# Patient Record
Sex: Female | Born: 1945 | Race: Black or African American | Hispanic: No | Marital: Single | Smoking: Never smoker
Health system: Southern US, Community
[De-identification: ages and names within clinical notes are randomized; demographics above are authoritative.]

## PROBLEM LIST (undated history)

## (undated) DIAGNOSIS — I10 Essential (primary) hypertension: Secondary | ICD-10-CM

---

## 2006-10-29 ENCOUNTER — Ambulatory Visit: Payer: Self-pay | Admitting: Internal Medicine

## 2006-10-29 ENCOUNTER — Encounter (INDEPENDENT_AMBULATORY_CARE_PROVIDER_SITE_OTHER): Payer: Self-pay | Admitting: Nurse Practitioner

## 2006-10-29 LAB — CONVERTED CEMR LAB
AST: 74 units/L — ABNORMAL HIGH (ref 0–37)
BUN: 14 mg/dL (ref 6–23)
Basophils Relative: 2 % — ABNORMAL HIGH (ref 0–1)
Eosinophils Absolute: 0.3 10*3/uL (ref 0.0–0.7)
Eosinophils Relative: 3 % (ref 0–5)
HCT: 16.1 % — ABNORMAL LOW (ref 36.0–46.0)
Helicobacter Pylori Antibody-IgG: 3.5 — ABNORMAL HIGH
Lymphocytes Relative: 40 % (ref 12–46)
Lymphs Abs: 3.9 10*3/uL — ABNORMAL HIGH (ref 0.7–3.3)
MCV: 100 fL (ref 78.0–100.0)
Neutrophils Relative %: 51 % (ref 43–77)
Platelets: 245 10*3/uL (ref 150–400)
RDW: 39 % — ABNORMAL HIGH (ref 11.5–14.0)
RPR Ser Ql: REACTIVE — AB
RPR Titer: 1:8 {titer}
Sodium: 138 meq/L (ref 135–145)
T Pallidum Abs: REACTIVE — AB
TSH: 3.017 microintl units/mL (ref 0.350–5.50)
Total Bilirubin: 1.7 mg/dL — ABNORMAL HIGH (ref 0.3–1.2)
Total Protein: 7.4 g/dL (ref 6.0–8.3)

## 2006-10-30 ENCOUNTER — Inpatient Hospital Stay (HOSPITAL_COMMUNITY): Admission: EM | Admit: 2006-10-30 | Discharge: 2006-11-04 | Payer: Self-pay | Admitting: *Deleted

## 2006-10-30 ENCOUNTER — Ambulatory Visit: Payer: Self-pay | Admitting: *Deleted

## 2006-11-01 ENCOUNTER — Ambulatory Visit: Payer: Self-pay | Admitting: Infectious Disease

## 2006-11-01 ENCOUNTER — Ambulatory Visit: Payer: Self-pay | Admitting: Oncology

## 2006-11-06 ENCOUNTER — Ambulatory Visit: Payer: Self-pay | Admitting: Gastroenterology

## 2006-11-08 ENCOUNTER — Ambulatory Visit: Payer: Self-pay | Admitting: Internal Medicine

## 2006-11-11 ENCOUNTER — Ambulatory Visit: Payer: Self-pay | Admitting: Internal Medicine

## 2006-11-15 ENCOUNTER — Ambulatory Visit: Payer: Self-pay | Admitting: Internal Medicine

## 2006-11-16 ENCOUNTER — Encounter (INDEPENDENT_AMBULATORY_CARE_PROVIDER_SITE_OTHER): Payer: Self-pay | Admitting: Nurse Practitioner

## 2006-11-16 LAB — CONVERTED CEMR LAB
Basophils Absolute: 0 10*3/uL (ref 0.0–0.1)
Lymphocytes Relative: 35 % (ref 12–46)
MCV: 97.3 fL (ref 78.0–100.0)
Monocytes Absolute: 0.8 10*3/uL — ABNORMAL HIGH (ref 0.2–0.7)
Monocytes Relative: 8 % (ref 3–11)
Neutro Abs: 5.2 10*3/uL (ref 1.7–7.7)
Platelets: 530 10*3/uL — ABNORMAL HIGH (ref 150–400)

## 2006-12-16 ENCOUNTER — Ambulatory Visit: Payer: Self-pay | Admitting: Internal Medicine

## 2007-01-24 ENCOUNTER — Encounter (INDEPENDENT_AMBULATORY_CARE_PROVIDER_SITE_OTHER): Payer: Self-pay | Admitting: Nurse Practitioner

## 2007-01-24 ENCOUNTER — Ambulatory Visit: Payer: Self-pay | Admitting: Internal Medicine

## 2007-01-24 LAB — CONVERTED CEMR LAB
Basophils Absolute: 0 10*3/uL (ref 0.0–0.1)
Eosinophils Relative: 6 % — ABNORMAL HIGH (ref 0–5)
HCT: 41.9 % (ref 36.0–46.0)
Hemoglobin: 12.9 g/dL (ref 12.0–15.0)
Lymphocytes Relative: 42 % (ref 12–46)
MCHC: 30.8 g/dL (ref 30.0–36.0)
MCV: 84.3 fL (ref 78.0–100.0)
Monocytes Relative: 6 % (ref 3–11)
RBC: 4.97 M/uL (ref 3.87–5.11)
Vitamin B-12: >2000 pg/mL — ABNORMAL HIGH (ref 211–911)

## 2007-03-14 ENCOUNTER — Encounter (INDEPENDENT_AMBULATORY_CARE_PROVIDER_SITE_OTHER): Payer: Self-pay | Admitting: Nurse Practitioner

## 2007-03-14 ENCOUNTER — Ambulatory Visit: Payer: Self-pay | Admitting: Internal Medicine

## 2007-03-14 LAB — CONVERTED CEMR LAB
Basophils Absolute: 0 10*3/uL (ref 0.0–0.1)
Basophils Relative: 0 % (ref 0–1)
Eosinophils Absolute: 0.4 10*3/uL (ref 0.2–0.7)
Eosinophils Relative: 5 % (ref 0–5)
MCV: 81.6 fL (ref 78.0–100.0)
Monocytes Absolute: 0.6 10*3/uL (ref 0.1–1.0)
Monocytes Relative: 7 % (ref 3–12)
RBC: 4.84 M/uL (ref 3.87–5.11)

## 2008-06-25 ENCOUNTER — Ambulatory Visit: Payer: Self-pay | Admitting: Family Medicine

## 2008-06-25 ENCOUNTER — Encounter (INDEPENDENT_AMBULATORY_CARE_PROVIDER_SITE_OTHER): Payer: Self-pay | Admitting: Internal Medicine

## 2008-06-25 LAB — CONVERTED CEMR LAB
ALT: 24 units/L (ref 0–35)
Albumin: 4.5 g/dL (ref 3.5–5.2)
Alkaline Phosphatase: 68 units/L (ref 39–117)
BUN: 11 mg/dL (ref 6–23)
Basophils Relative: 0 % (ref 0–1)
CO2: 22 meq/L (ref 19–32)
Calcium: 9.3 mg/dL (ref 8.4–10.5)
Chloride: 104 meq/L (ref 96–112)
Eosinophils Absolute: 0.3 10*3/uL (ref 0.0–0.7)
Hemoglobin: 11.2 g/dL — ABNORMAL LOW (ref 12.0–15.0)
Lymphocytes Relative: 31 % (ref 12–46)
Lymphs Abs: 3.3 10*3/uL (ref 0.7–4.0)
MCHC: 29.2 g/dL — ABNORMAL LOW (ref 30.0–36.0)
Neutro Abs: 6.3 10*3/uL (ref 1.7–7.7)
Platelets: 402 10*3/uL — ABNORMAL HIGH (ref 150–400)
RDW: 16.3 % — ABNORMAL HIGH (ref 11.5–15.5)
TSH: 3.202 microintl units/mL (ref 0.350–4.500)
Total Bilirubin: 0.2 mg/dL — ABNORMAL LOW (ref 0.3–1.2)
Total Protein: 7.9 g/dL (ref 6.0–8.3)
Vitamin B-12: 494 pg/mL (ref 211–911)
WBC: 10.7 10*3/uL — ABNORMAL HIGH (ref 4.0–10.5)

## 2008-06-26 ENCOUNTER — Encounter (INDEPENDENT_AMBULATORY_CARE_PROVIDER_SITE_OTHER): Payer: Self-pay | Admitting: Internal Medicine

## 2008-06-28 ENCOUNTER — Encounter (INDEPENDENT_AMBULATORY_CARE_PROVIDER_SITE_OTHER): Payer: Self-pay | Admitting: Internal Medicine

## 2008-06-28 ENCOUNTER — Ambulatory Visit: Payer: Self-pay | Admitting: Internal Medicine

## 2008-06-28 LAB — CONVERTED CEMR LAB
Saturation Ratios: 16 % — ABNORMAL LOW (ref 20–55)
UIBC: 257 ug/dL

## 2008-06-29 ENCOUNTER — Encounter (INDEPENDENT_AMBULATORY_CARE_PROVIDER_SITE_OTHER): Payer: Self-pay | Admitting: Internal Medicine

## 2008-07-14 ENCOUNTER — Encounter (INDEPENDENT_AMBULATORY_CARE_PROVIDER_SITE_OTHER): Payer: Self-pay | Admitting: Internal Medicine

## 2008-07-14 ENCOUNTER — Ambulatory Visit: Payer: Self-pay | Admitting: Internal Medicine

## 2008-07-14 LAB — CONVERTED CEMR LAB: RPR Ser Ql: REACTIVE — AB

## 2008-07-16 ENCOUNTER — Ambulatory Visit: Payer: Self-pay | Admitting: Internal Medicine

## 2008-07-20 ENCOUNTER — Ambulatory Visit: Payer: Self-pay | Admitting: Internal Medicine

## 2008-07-20 ENCOUNTER — Encounter (INDEPENDENT_AMBULATORY_CARE_PROVIDER_SITE_OTHER): Payer: Self-pay | Admitting: Internal Medicine

## 2008-07-20 LAB — CONVERTED CEMR LAB: Varicella IgG: 4.47 — ABNORMAL HIGH

## 2008-07-22 ENCOUNTER — Encounter: Admission: RE | Admit: 2008-07-22 | Discharge: 2008-07-22 | Payer: Self-pay | Admitting: Pulmonary Disease

## 2008-08-16 ENCOUNTER — Ambulatory Visit: Payer: Self-pay | Admitting: Family Medicine

## 2008-08-23 ENCOUNTER — Ambulatory Visit: Payer: Self-pay | Admitting: Internal Medicine

## 2008-08-30 ENCOUNTER — Ambulatory Visit: Payer: Self-pay | Admitting: Internal Medicine

## 2009-01-04 IMAGING — US US ABDOMEN COMPLETE
1 series · 3 of 3 positions shown · non-contrast
Comparison: none

CLINICAL DATA: Elevated enzymes.
ABDOMINAL ULTRASOUND:
TECHNIQUE: Complete abdominal ultrasound examination was performed including evaluation of the liver, gallbladder, bile ducts, pancreas, kidneys, spleen, IVC, and abdominal aorta.

[Series 1: unknown · 0.25mm/px · 3 of 3 slices shown]
[im 1/3]
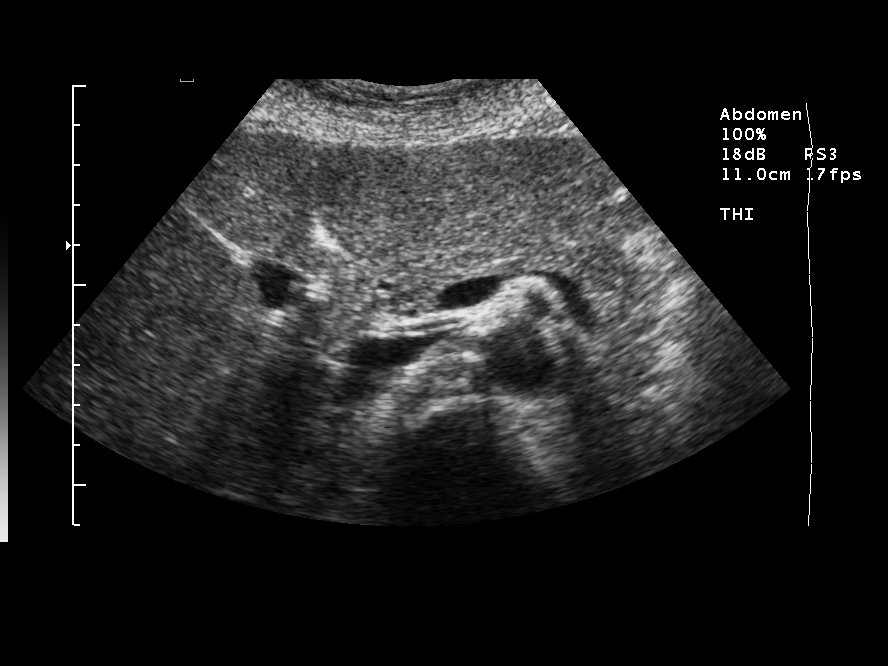
[im 2/3]
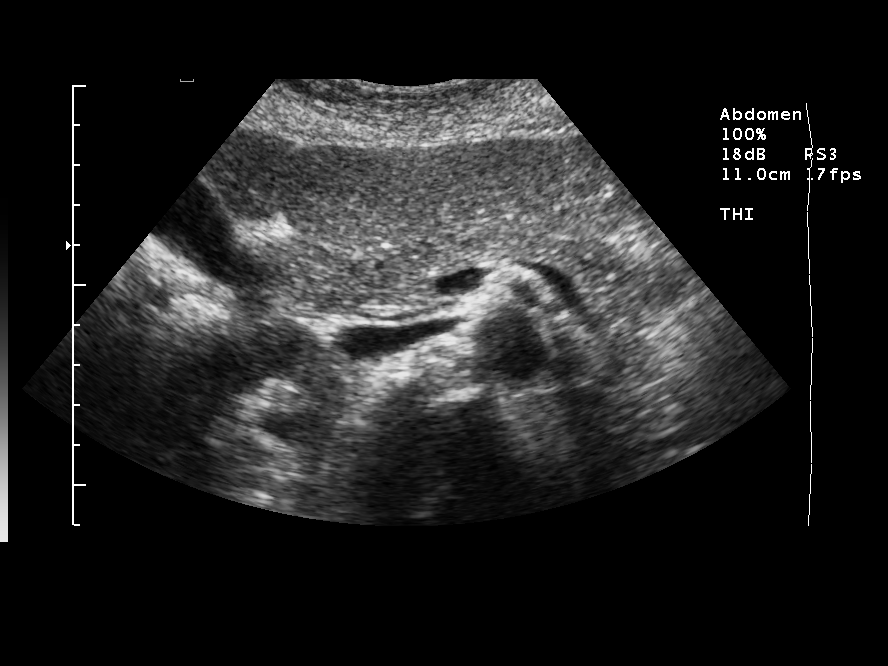
[im 3/3]
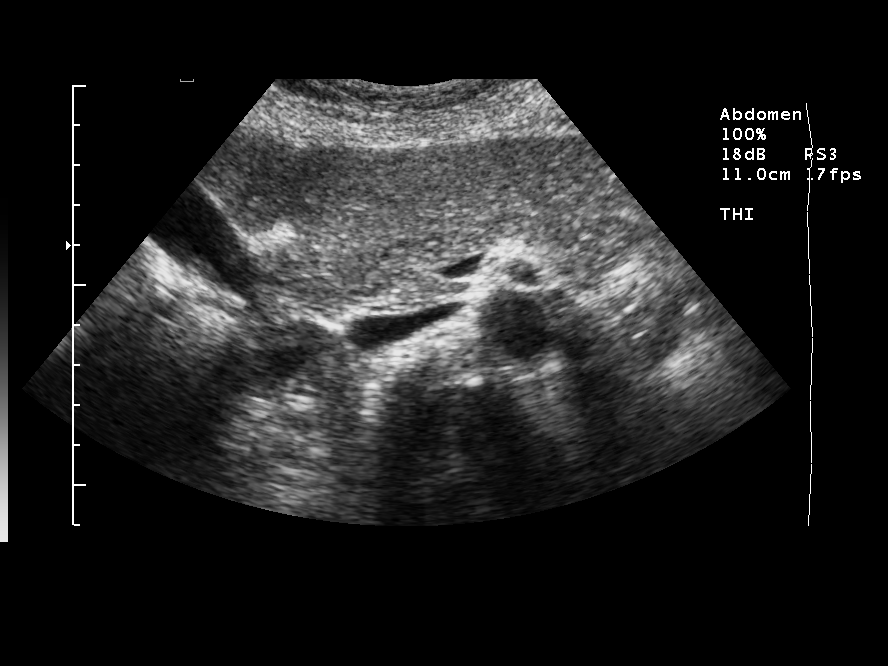

[3 of 3 positions shown; findings below may reference images not displayed]

FINDINGS: Slightly heterogeneous appearance of the liver without focal abnormality.  There is also slightly increased echogenicity of the liver.  No evidence for gallstones or sludge.  Common bile duct measures 4mm.  Right kidney has normal morphology measuring 9.7cm without hydronephrosis.  Left kidney measures 10cm in length without hydronephrosis.  No evidence for ascites.
IMPRESSION: 1. Heterogeneous and slightly increased echogenicity of the liver may represent a fatty liver.
2. No evidence for gallstones or biliary dilatation.

## 2009-01-17 ENCOUNTER — Ambulatory Visit: Payer: Self-pay | Admitting: Family Medicine

## 2009-01-20 ENCOUNTER — Encounter: Admission: RE | Admit: 2009-01-20 | Discharge: 2009-01-20 | Payer: Self-pay | Admitting: Internal Medicine

## 2010-01-27 ENCOUNTER — Encounter (INDEPENDENT_AMBULATORY_CARE_PROVIDER_SITE_OTHER): Payer: Self-pay | Admitting: *Deleted

## 2010-01-27 LAB — CONVERTED CEMR LAB
Basophils Absolute: 0 10*3/uL (ref 0.0–0.1)
Basophils Relative: 0 % (ref 0–1)
CO2: 24 meq/L (ref 19–32)
Calcium: 9.5 mg/dL (ref 8.4–10.5)
Chloride: 103 meq/L (ref 96–112)
Eosinophils Absolute: 0.5 10*3/uL (ref 0.0–0.7)
Hemoglobin: 11.8 g/dL — ABNORMAL LOW (ref 12.0–15.0)
Lymphocytes Relative: 37 % (ref 12–46)
MCV: 77.4 fL — ABNORMAL LOW (ref 78.0–100.0)
Monocytes Absolute: 0.5 10*3/uL (ref 0.1–1.0)
Monocytes Relative: 6 % (ref 3–12)
Neutrophils Relative %: 51 % (ref 43–77)
Platelets: 357 10*3/uL (ref 150–400)
Potassium: 4.2 meq/L (ref 3.5–5.3)
Sodium: 142 meq/L (ref 135–145)
Total Protein: 7.7 g/dL (ref 6.0–8.3)

## 2010-08-22 NOTE — H&P (Signed)
NAMEGRACEE, RATTERREE          ACCOUNT NO.:  [id number]   MEDICAL RECORD NO.:  [id number]          PATIENT TYPE:  EMS   LOCATION:  MAJO                         FACILITY:  [facility]   PHYSICIAN:  Michaelyn Barter, M.D. DATE OF BIRTH:  1946-02-20   DATE OF ADMISSION:  10/30/2006  DATE OF DISCHARGE:                              HISTORY & PHYSICAL   PRIMARY CARE PHYSICIAN:  Unassigned.   CHIEF COMPLAINT:  Abnormal hemoglobin level.   HISTORY OF PRESENT ILLNESS:  The patient is a 65 year old [place] female  who speaks no [place].  Her son, Greggory Stallion, is at the bedside and he gives  the history.  He states that he took his mother, who is the patient, to  [facility] yesterday for routine checkup.  Routine labs were drawn.  This morning the patient's son was called and told that the patient's  hemoglobin was very low and that he should bring her to the hospital for  further evaluation.  According to the patient's son, the patient has not  had any recent complaints.  She does occasionally complain of dizziness,  but not over the past day or two.  The patient denies having any belly  pain, there has been no bright red blood in her stools, no black stools,  no vomiting of blood, no shortness of breath, and no chest pain.   PAST MEDICAL HISTORY:  No illnesses.   PAST SURGICAL HISTORY:  None.   ALLERGIES:  No known drug allergies.   HOME MEDICATIONS:  None.   FAMILY HISTORY:  Mother's medical history is unknown, father's medical  history is unknown.   SOCIAL HISTORY:  Cigarettes - denies.  Alcohol - denies.   REVIEW OF SYSTEMS:  As per HPI.   PHYSICAL EXAMINATION:  GENERAL:  The patient is awake.  She is in no  obvious respiratory distress.  She looks comfortable.  VITAL SIGNS:  Temperature 97.1, blood pressure 98/61, heart rate 95,  respirations 18, O2 saturation 100% on room air.  HEENT:  Normocephalic and atraumatic.  Pupils equal, round, and reactive  to light.  Oral mucosa is  pink.  No thrush and no exudates.  NECK:  Supple.  Neck veins are distended.  No lymphadenopathy and no  thyromegaly.  HEART:  S1 and S2 present, regular rate and rhythm.  No murmurs, rubs,  or gallops.  LUNGS:  No crackles or wheezes.  ABDOMEN:  Flat, soft, nontender, and nondistended.  No palpable  hepatosplenomegaly.  No masses.  Bowel sounds are present x4 quadrants.  EXTREMITIES:  No leg edema.  NEUROLOGY:  The patient is alert and oriented to person and date.  With  regards to place, the patient's son indicated that she is not sure where  she is at.  MUSCULOSKELETAL:  It is difficult to get the patient to follow commands  secondary to the language barrier, therefore, I could not assess  musculoskeletal.  RECTAL:  This was done by the emergency room physician's assistant,  Charlestine Night.  According to Mr. Lawyer there was very scant amount  of blood in the rectal vault.  There was no obvious bleeding, no  bright  red blood.  The patient was heme negative.  No masses were palpated.   LABORATORY DATA:  These labs were drawn at [facility].  The patient's  white blood cell count 9.7, hemoglobin 4.9, hematocrit 16.1, platelets  39.0.  Target cells were noted, schistocytes were present. Sodium 138,  potassium 4.5, chloride 103, CO2 28, glucose 91, BUN 14, creatinine  0.75.  Bilirubin total 1.7, alkaline phosphatase 58, AST 74, ALT 17,  total protein 7.4, albumin 4.4, calcium 9.3.  TSH 3.017.  HIV  nonreactive.  RPR reactive.  RPR titer 1:8.   ASSESSMENT:  1. Severe anemia.  The acuity of this is questionable.  The source is      also questionable.  This is possibly GI versus other sources      related.  It appears that the patient has been tolerating the low      anemia for quite some time although she has had some dizziness      which may have been related to the severe anemia.  The severe      anemia is possibly secondary to GI versus other source.  The      presence of  schistocytes makes one concerned about the possibility      of intravascular hemolysis.  We will go ahead and transfuse the      patient several units of packed red blood cells for now.  We will      check a DIC profile as well as a Comb's test.  We will request GI      consultation in the morning.  The patient may need an EGD versus      colonoscopy.  We will start the patient on Protonix and we will      order frequent H&H's for now.  2. Positive RPR.  We will repeat this for now.  3. Elevated SGOT and bilirubin on labs from [facility].  This      may again be related to the patient's current anemia and/or the      process that triggered it.  The patient currently denies having any      abdominal pain.  However, we will go ahead and check an ultrasound      of the patient's liver.  4. Mild pyuria.  The patient is otherwise asymptomatic secondary to      this.  We will consider starting an empiric antibiotic.      Michaelyn Barter, M.D.  Electronically Signed     OR/MEDQ  D:  10/30/2006  T:  10/31/2006  Job:  [id number]

## 2010-08-22 NOTE — Discharge Summary (Signed)
[name], [name]          ACCOUNT NO.:  [id number]   MEDICAL RECORD NO.:  [id number]          PATIENT TYPE:  INP   LOCATION:  6729                         FACILITY:  [facility]   PHYSICIAN:  Isidor Holts, M.D.  DATE OF BIRTH:  May 09, 1945   DATE OF ADMISSION:  10/30/2006  DATE OF DISCHARGE:  11/04/2006                               DISCHARGE SUMMARY   PMD:  [facility].  Patient is unassigned to [place].   DISCHARGE DIAGNOSES:  1. Vitamin B12 deficiency.  2. Profound anemia and low grade hemolysis, secondary to # 1 above.  3. Late latent syphilis.  4. Fatty liver.   DISCHARGE MEDICATIONS:  1. Folic acid 1 mg p.o. daily.  2. Vitamin B12 injection 1 mg IM q.monthly.  3. Penicillin G benzathine 2.4 million units intramuscularly on November 09, 2006, and again on November 16, 2006, then stop.   PROCEDURES:  Abdominal ultrasound scan dated October 31, 2006.  This showed  heterogenous and slightly increased echogenicity of the liver, which may  represent a fatty liver.  There was no evidence of gallstones or biliary  dilatation.   CONSULTATIONS:  1. Dr. Melvia Heaps, Gastroenterology.  2. Dr. Russella Dar, Gastroenterology.  3. Dr. Paulette Blanch Dam, Infectious Diseases.  4. Dr. Orvan Falconer, Infectious Diseases.  5. Dr. Darrold Span, hematologist.   ADMISSION HISTORY:  As in H&P notes of October 30, 2006, dictated by Dr.  Michaelyn Barter.  However, in brief, this is a 65 year old [place]  female, with no significant past medical or surgical history who was  referred from [facility] following incidental discovery of a  very low hemoglobin level on routine laboratory tests, during her office  visit.  On detailed questioning, she admitted to occasional episodes of  dizziness but denied shortness of breath or chest pain, denied  hematochezia or melena.  She was admitted for further evaluation,  investigation and management.   CLINICAL COURSE:  1. Profound anemia.  For details of  presentation, refer to Admission      History above.  The patient was found on laboratory testing to have      a hemoglobin of 4.9, hematocrit 16.1, platelets 39,000, WBC 9.7.      Blood smear showed target cells, schistocytes, left shift with      elevated D-dimer and normal fibrinogen levels.  Iron studies      demonstrated iron of 115, TIBC 227, % saturation 42, ferritin 174.      B12 was markedly diminished at 92, folate was 12.5, haptoglobin was      less than 6.  Clearly, these findings were consistent with profound      Vitamin B12 deficiency and low grade hemolysis.  Patient was      transfused with a total of 4 units PRBC, with satisfactory bump in      hemoglobin levels to 11.9 on November 01, 2006.  Hematology      consultation was called, which was kindly provided by Dr. Darrold Span      who has opined that patient's hematologic findings were entirely      consistent with Vitamin B12  deficiency and recommended Vitamin B12      replacement which has been instituted accordingly, with monthly      Vitamin B12 injections.  In addition, patient has been placed on      folate supplementation.   1. Fatty liver.  Patient at the time of presentation was found to have      mildly abnormal LFTs with total bilirubin of 1.7, alkaline      phosphatase 58, AST 74, ALT 17.  She underwent a liver ultrasound      scan.  For details of findings, refer to Procedure List above.      This demonstrated possible fatty liver.  A Hepatitis profile was      done which showed positive Hepatitis B Core Antibody, positive      Hepatitis B Surface Antibody, i.e. consistent with immunity,      Hepatitis C Antibody was negative.  An RPR was done, and came back      positive at a titer of 1:8.  This was followed by TPPA, which      returned positive, necessitating Infectious Diseases consultation,      which was kindly provided by Drs. Cornelius Kingsland and Cliffton Asters.  Patient has been diagnosed with late  latent syphilis and      recommended a 3 week course of Penicillin G benzathine on a weekly      basis.  The first dose was administered on November 02, 2006, and it is      anticipated that she will complete this course of treatment on      November 16, 2006, under the auspices of [facility] on an outpatient      basis.   1. Late latent syphilis.  For details, refer to #2 above.  This      diagnosis was based on a finding of a positive RPR in a titer of      1:8 as well as a positive TPPA and patient is currently on      treatment with intramuscular injections of Penicillin G benzathine.   DISPOSITION:  Gastroenterology consultation was called to evaluate the  possibility of GI workup for patient's profound anemia.  Patient was  therefore seen by Drs. Melvia Heaps and Claudette Head.  Fecal occult  blood testing was negative.  In view of patient's Vitamin B12  deficiency, she has been recommended anti-intrinsic factor and  antigastric parietal cell antibodies.  These studies have been ordered,  but at the time of this dictation were still pending.  Per Dr. Claudette Head, should these prove positive, patient may be eligible for upper GI  endoscopy; presumably, this may be arranged on an outpatient basis.  It  is, however, opined that colonoscopy at this time is not warranted for  patient's anemia; although, on the basis of routine screening purposes  given patient's age, this might be a consideration in the future.  We  have deferred decision to the gastroenterologist.  Patient had thick and  thin smear sent for malaria studies; however, this was negative.   Provided no acute problems arise in the interim, it is anticipated that  patient will be discharged on or about November 04, 2006.   DIET:  No restrictions.   ACTIVITY:  As tolerated.   FOLLOWUP INSTRUCTIONS:  Patient is to follow up with her primary M.D. at  [facility] with in 1 week of discharge.  She is also to follow up  with   gastroenterologist, Dr. Claudette Head or Dr. Arlyce Dice at a date to be  determined.   SPECIAL INSTRUCTIONS:  We anticipate that patient will continue to  receive her Vitamin B12 injections at the [facility].  However,  her Penicillin G benzathine injections may be more problematic.  This  may, of course, be completed at the [facility] clinic.  Alternatively,  she may have this done at the [facility] at [facility].      Isidor Holts, M.D.  Electronically Signed     CO/MEDQ  D:  11/03/2006  T:  11/03/2006  Job:  [id number]   cc:   Kelly Splinter. Russella Dar, MD, Joella Prince D. Arlyce Dice, MD,FACG  Cliffton Asters, M.D.

## 2010-08-22 NOTE — Consult Note (Signed)
[name], [name]          ACCOUNT NO.:  [id number]   MEDICAL RECORD NO.:  [id number]          PATIENT TYPE:  INP   LOCATION:  3733                         FACILITY:  [facility]   PHYSICIAN:  Lennis P. Livesay, M.D.DATE OF BIRTH:  [birth year]   DATE OF CONSULTATION:  11/01/2006  DATE OF DISCHARGE:                                 CONSULTATION   HEMATOLOGY CONSULT NOTE:   HISTORY OF PRESENT ILLNESS:  The patient is a 65 year old lady from  [place], seen in consultation at the request of the hospitalist service,  for severe anemia.  History is from the son as interpreter and the  hospital records.  We have very little information about her past  medical history.  Patient had presented to [facility] the day prior to  admission with labs there showing a hemoglobin of 4.9.  She was admitted  through the emergency department to the hospitalist service at the  request of [facility].  Workup is continuing, with some significant  findings already to this point.  The patient had not been aware of any  bleeding prior to admission.  She denies shortness of breath or pain  other than right upper quadrant pain, which has been intermittent today.  She has not eaten well shortly prior to admission, had not vomiting and  I cannot tell if she had lost taste with the food.  At any rate, her son  tells me that she has lost some amount of weight, but is unclear about  this.  She has felt much stronger since packed red cell transfusions,  walking in the room today and has been able to eat better.   EVALUATION IN HOSPITAL:  Include labs on October 30, 2006, hemoglobin 5.4,  white count 7.8, platelets 326, MCV 97, RDW 42, segs 70, lymphs 25,  monos 3, EOs 2, blast 0, shift-to-sites, teardrops and polychromasia on  the smear.  Abdominal ultrasound negative for gallstones, no mention of  any spleen abnormalities and liver with fatty replacement.  She has had  4 units of packed red cells, with repeat CBC July  25, after the  transfusions, hemoglobin up to 11.9, white count 7.3, platelets 235, BUN  is 11, creatinine 0.7.  Iron stores good with serum iron 113, 42%  saturated, ferritin 174.  DAT negative.  Total bilirubin 1.6.  Folate  normal at 174.  B12 low at 92.  Heptoglobin less than 6.  Stools  negative per GI.  Urinalysis 3-5 red cells.  HIV negative.  RPR is low  positive titer.  Hepatitis B pending.  Malaria smear is pending and she  has been seen in consultation by GI and infectious disease.   In addition to 4 units of packed red cell transfusions, patient has  begun folic acid at 1 mg daily and B12 1000 mg IM daily x5.  Antibodies  to intrinsic factor and parietal cells are pending.   PHYSICAL EXAMINATION:  GENERAL:  She is a pleasant lady, looks comfort  supine in bed on room air.  She does not speak [place], but she seems to  understand a little and son is interpreter.  VITAL SIGNS:  Temperature 97.1.  Heart rate 71 and regular.  Respirations 20.  Blood pressure 97/61, 99% saturated room air.  NECK:  No cervical or lymph nodes.  LUNGS:  Clear.  HEENT:  Mucous membranes are pink and moist and her tongue does not  appear extremely smooth.  HEART:  Has a regular rate and rhythm.  She is not tender in the  epigastrium or the right upper quadrant.  I cannot get her undressed to  do a breast exam.  ABDOMEN:  Soft, quiet, no hepatosplenomegaly.  LOWER EXTREMITIES:  No edema, cords or tenderness.   Peripheral blood smear requested and I will review.   IMPRESSION/RECOMMENDATION:  1. Severe anemia with some schistocytes, left shift, low heptoglobin,      etc. Could all be secondary to B12 deficiency.  I agree with the      B12 as planned and a supplemental folate.  We will await malaria      investigation and rest of studies by infectious disease and      gastroenterology.  2. We will followup next week.  Please call over the weekend if my      partners can assist.  Thank you for the  consultation.      Lennis P. Darrold Span, M.D.  Electronically Signed     LPL/MEDQ  D:  11/01/2006  T:  11/02/2006  Job:  [id number]

## 2011-01-22 LAB — CROSSMATCH
ABO/RH(D): A POS
Antibody Screen: NEGATIVE

## 2011-01-22 LAB — DIFFERENTIAL
Band Neutrophils: 0
Basophils Absolute: 0
Basophils Relative: 0
Blasts: 0
Eosinophils Absolute: 0.2
Eosinophils Relative: 2
Lymphocytes Relative: 25
Lymphs Abs: 2
Metamyelocytes Relative: 0
Monocytes Absolute: 0.2
Monocytes Relative: 3
Myelocytes: 0
Neutro Abs: 5.4
Neutrophils Relative %: 70
Promyelocytes Absolute: 0
nRBC: 0

## 2011-01-22 LAB — URINALYSIS, ROUTINE W REFLEX MICROSCOPIC
Bilirubin Urine: NEGATIVE
Glucose, UA: NEGATIVE
Ketones, ur: NEGATIVE
Nitrite: NEGATIVE
Protein, ur: 30 — AB
Specific Gravity, Urine: 1.012
Urobilinogen, UA: 2 — ABNORMAL HIGH
pH: 6.5

## 2011-01-22 LAB — I-STAT 8, (EC8 V) (CONVERTED LAB)
Acid-Base Excess: 1
BUN: 14
Bicarbonate: 25.5 — ABNORMAL HIGH
Chloride: 105
Glucose, Bld: 117 — ABNORMAL HIGH
HCT: 19 — ABNORMAL LOW
Hemoglobin: 6.5 — CL
Operator id: 272551
Potassium: 3.9
Sodium: 139
TCO2: 27
pCO2, Ven: 41.4 — ABNORMAL LOW
pH, Ven: 7.397 — ABNORMAL HIGH

## 2011-01-22 LAB — DIC (DISSEMINATED INTRAVASCULAR COAGULATION)PANEL
INR: 1
Platelets: 271
aPTT: 26

## 2011-01-22 LAB — CBC
HCT: 15.9 — ABNORMAL LOW
HCT: 29.7 — ABNORMAL LOW
HCT: 31.3 — ABNORMAL LOW
HCT: 35.1 — ABNORMAL LOW
HCT: 36.3
Hemoglobin: 10 — ABNORMAL LOW
Hemoglobin: 11.9 — ABNORMAL LOW
Hemoglobin: 12.3
Hemoglobin: 5.4 — CL
MCHC: 33.9
MCHC: 34
MCV: 95.8
MCV: 97.8
Platelets: 169
Platelets: 219
Platelets: 271
Platelets: 326
RBC: 1.63 — ABNORMAL LOW
RBC: 3.09 — ABNORMAL LOW
RDW: 17.1 — ABNORMAL HIGH
RDW: 42.3 — ABNORMAL HIGH
WBC: 5.9
WBC: 6.8
WBC: 7.7
WBC: 7.8

## 2011-01-22 LAB — MALARIA SMEAR

## 2011-01-22 LAB — COMPREHENSIVE METABOLIC PANEL
Albumin: 4.2
Alkaline Phosphatase: 62
BUN: 7
CO2: 26
Chloride: 102
GFR calc non Af Amer: >60
Potassium: 3.6
Total Bilirubin: 1.6 — ABNORMAL HIGH

## 2011-01-22 LAB — FERRITIN: Ferritin: 174 (ref 10–291)

## 2011-01-22 LAB — URINE MICROSCOPIC-ADD ON

## 2011-01-22 LAB — HEPATIC FUNCTION PANEL
ALT: 18
AST: 71 — ABNORMAL HIGH
Albumin: 3.9
Alkaline Phosphatase: 52
Bilirubin, Direct: 0.4 — ABNORMAL HIGH
Indirect Bilirubin: 0.8
Total Bilirubin: 1.2
Total Protein: 6.7

## 2011-01-22 LAB — BASIC METABOLIC PANEL
BUN: 18
CO2: 29
Chloride: 109
GFR calc Af Amer: >60
GFR calc non Af Amer: >60
Glucose, Bld: 92
Potassium: 3.4 — ABNORMAL LOW
Potassium: 3.9
Potassium: 4.4
Sodium: 137
Sodium: 139

## 2011-01-22 LAB — IRON AND TIBC
Iron: 113
Saturation Ratios: 42
TIBC: 271
UIBC: 158

## 2011-01-22 LAB — RPR: RPR Ser Ql: REACTIVE — AB

## 2011-01-22 LAB — FOLATE: Folate: 12.5

## 2011-01-22 LAB — VITAMIN B12: Vitamin B-12: 92 — ABNORMAL LOW (ref 211–911)

## 2011-01-22 LAB — HEPATITIS C ANTIBODY: HCV Ab: NEGATIVE

## 2011-01-22 LAB — POCT I-STAT CREATININE
Creatinine, Ser: 0.7
Operator id: 272551

## 2011-01-22 LAB — PATHOLOGIST SMEAR REVIEW

## 2011-01-22 LAB — HAPTOGLOBIN: Haptoglobin: <6 — ABNORMAL LOW

## 2011-01-22 LAB — D-DIMER, QUANTITATIVE: D-Dimer, Quant: 17.02 — ABNORMAL HIGH

## 2018-01-30 ENCOUNTER — Emergency Department (HOSPITAL_COMMUNITY)
Admission: EM | Admit: 2018-01-30 | Discharge: 2018-01-31 | Disposition: A | Discharge status: Home / Self Care | Payer: Medicaid Other | Attending: Emergency Medicine | Admitting: Emergency Medicine

## 2018-01-30 ENCOUNTER — Encounter (HOSPITAL_COMMUNITY): Payer: Self-pay | Admitting: Emergency Medicine

## 2018-01-30 ENCOUNTER — Emergency Department (HOSPITAL_COMMUNITY): Payer: Medicaid Other

## 2018-01-30 ENCOUNTER — Other Ambulatory Visit: Payer: Self-pay

## 2018-01-30 DIAGNOSIS — I951 Orthostatic hypotension: Secondary | ICD-10-CM

## 2018-01-30 DIAGNOSIS — N390 Urinary tract infection, site not specified: Secondary | ICD-10-CM | POA: Diagnosis not present

## 2018-01-30 DIAGNOSIS — R42 Dizziness and giddiness: Secondary | ICD-10-CM | POA: Diagnosis present

## 2018-01-30 HISTORY — DX: Essential (primary) hypertension: I10

## 2018-01-30 LAB — BASIC METABOLIC PANEL
ANION GAP: 9 (ref 5–15)
BUN: 13 mg/dL (ref 8–23)
CO2: 26 mmol/L (ref 22–32)
Calcium: 9.5 mg/dL (ref 8.9–10.3)
Chloride: 104 mmol/L (ref 98–111)
Creatinine, Ser: 0.8 mg/dL (ref 0.44–1.00)
GFR calc Af Amer: >60 mL/min (ref >60)
GFR calc non Af Amer: >60 mL/min (ref >60)
GLUCOSE: 100 mg/dL — AB (ref 70–99)
POTASSIUM: 3.8 mmol/L (ref 3.5–5.1)
Sodium: 139 mmol/L (ref 135–145)

## 2018-01-30 LAB — CBC
HCT: 37.4 % (ref 36.0–46.0)
HEMOGLOBIN: 11 g/dL — AB (ref 12.0–15.0)
MCH: 22.6 pg — ABNORMAL LOW (ref 26.0–34.0)
MCHC: 29.4 g/dL — ABNORMAL LOW (ref 30.0–36.0)
MCV: 76.8 fL — ABNORMAL LOW (ref 80.0–100.0)
Platelets: 382 10*3/uL (ref 150–400)
RBC: 4.87 MIL/uL (ref 3.87–5.11)
RDW: 16 % — ABNORMAL HIGH (ref 11.5–15.5)
WBC: 9.6 10*3/uL (ref 4.0–10.5)
nRBC: 0 % (ref 0.0–0.2)

## 2018-01-30 NOTE — ED Provider Notes (Signed)
[facility] EMERGENCY DEPARTMENT Provider Note   CSN: [id number] Arrival date & time: 01/30/18  1602     History   Chief Complaint Chief Complaint  Patient presents with  . Dizziness    HPI [name] is a 73 y.o. female.  Level 5 caveat for language barrier.  Son used as Nurse, learning disability.  Patient speaks [place].  Son reports patient had sensation of "heart beating fast" throughout last night and all day today.  This is associated with palpitations.  No chest pain or shortness of breath.  Son reports patient has had this for several months in the past but never been evaluated by Dr. before.  She has a history of hypertension but has not had medications since June.  She last left the country in June.  She feels back to baseline now.  She denies headache, visual changes, chest pain, shortness of breath, abdominal pain, nausea, vomiting or diarrhea.  No focal neurological deficits.  No difficulty speaking or difficulty swallowing.  No visual changes.  She said chills and hot flashes at home but no documented fever. She denies any vertigo or spinning sensation.  She describes more lightheadedness and hot flashes or palpitations but the symptoms have since resolved.  The history is provided by the patient and a relative. The history is limited by a language barrier. A language interpreter was used.  Dizziness  Associated symptoms: palpitations   Associated symptoms: no chest pain, no nausea, no shortness of breath, no vomiting and no weakness     Past Medical History:  Diagnosis Date  . Hypertension     There are no active problems to display for this patient.   History reviewed. No pertinent surgical history.   OB History   None      Home Medications    Prior to Admission medications   Not on File    Family History No family history on file.  Social History Social History   Tobacco Use  . Smoking status: Never Smoker  . Smokeless tobacco:  Never Used  Substance Use Topics  . Alcohol use: Never    Frequency: Never  . Drug use: Never     Allergies   Patient has no known allergies.   Review of Systems Review of Systems  Constitutional: Positive for chills and fatigue. Negative for activity change, appetite change and fever.  HENT: Negative for congestion.   Eyes: Negative for photophobia and visual disturbance.  Respiratory: Negative for cough, chest tightness and shortness of breath.   Cardiovascular: Positive for palpitations. Negative for chest pain.  Gastrointestinal: Negative for abdominal pain, nausea and vomiting.  Genitourinary: Negative for dysuria, hematuria, vaginal bleeding and vaginal discharge.  Musculoskeletal: Negative for arthralgias and myalgias.  Neurological: Positive for dizziness and light-headedness. Negative for syncope, weakness and numbness.  Hematological: Negative for adenopathy.  Psychiatric/Behavioral: Negative for behavioral problems.    all other systems are negative except as noted in the HPI and PMH.    Physical Exam Updated Vital Signs BP (!) 158/88 (BP Location: Left Arm)   Pulse 73   Temp 98.1 F (36.7 C) (Temporal)   Resp 20   SpO2 (!) 0%   Physical Exam  Constitutional: She is oriented to person, place, and time. She appears well-developed and well-nourished. No distress.  HENT:  Head: Normocephalic and atraumatic.  Mouth/Throat: Oropharynx is clear and moist. No oropharyngeal exudate.  Eyes: Pupils are equal, round, and reactive to light. Conjunctivae and EOM are normal.  Neck: Normal range of motion. Neck supple.  No meningismus.  Cardiovascular: Normal rate, regular rhythm, normal heart sounds and intact distal pulses.  No murmur heard. Pulmonary/Chest: Effort normal and breath sounds normal. No respiratory distress. She exhibits no tenderness.  Abdominal: Soft. There is no tenderness. There is no rebound and no guarding.  Musculoskeletal: Normal range of motion.  She exhibits no edema or tenderness.  Neurological: She is alert and oriented to person, place, and time. No cranial nerve deficit. She exhibits normal muscle tone. Coordination normal.  CN 2-12 intact, no ataxia on finger to nose, no nystagmus, 5/5 strength throughout, no pronator drift, Romberg negative, normal gait.   Skin: Skin is warm.  Psychiatric: She has a normal mood and affect. Her behavior is normal.  Nursing note and vitals reviewed.    ED Treatments / Results  Labs (all labs ordered are listed, but only abnormal results are displayed) Labs Reviewed  BASIC METABOLIC PANEL - Abnormal; Notable for the following components:      Result Value   Glucose, Bld 100 (*)    All other components within normal limits  CBC - Abnormal; Notable for the following components:   Hemoglobin 11.0 (*)    MCV 76.8 (*)    MCH 22.6 (*)    MCHC 29.4 (*)    RDW 16.0 (*)    All other components within normal limits  URINALYSIS, ROUTINE W REFLEX MICROSCOPIC - Abnormal; Notable for the following components:   APPearance HAZY (*)    Hgb urine dipstick SMALL (*)    Leukocytes, UA LARGE (*)    Bacteria, UA RARE (*)    All other components within normal limits  TSH - Abnormal; Notable for the following components:   TSH 4.621 (*)    All other components within normal limits  URINE CULTURE  HEPATIC FUNCTION PANEL  TROPONIN I  T4, FREE  CBG MONITORING, ED  I-STAT TROPONIN, ED    EKG EKG Interpretation  Date/Time:  Thursday January 30 2018 16:10:28 EDT Ventricular Rate:  87 PR Interval:  166 QRS Duration: 70 QT Interval:  368 QTC Calculation: 442 R Axis:   52 Text Interpretation:  Normal sinus rhythm Normal ECG No significant change was found Confirmed by Glynn Octave [phone]) on 01/30/2018 10:54:41 PM   Radiology Dg Chest 2 View  Result Date: 01/30/2018 CLINICAL DATA:  Weakness EXAM: CHEST - 2 VIEW COMPARISON:  07/22/2008 FINDINGS: Mild cardiomegaly. No significant effusion. No  focal consolidation. No pneumothorax. IMPRESSION: No active cardiopulmonary disease.  Mild cardiomegaly Electronically Signed   By: Jasmine Pang M.D.   On: 01/30/2018 23:36   Ct Head Wo Contrast  Result Date: 01/30/2018 CLINICAL DATA:  Vertigo EXAM: CT HEAD WITHOUT CONTRAST TECHNIQUE: Contiguous axial images were obtained from the base of the skull through the vertex without intravenous contrast. COMPARISON:  None. FINDINGS: Brain: Physiologic calcifications in the basal ganglia. No acute intracranial abnormality. Specifically, no hemorrhage, hydrocephalus, mass lesion, acute infarction, or significant intracranial injury. Vascular: No hyperdense vessel or unexpected calcification. Skull: No acute calvarial abnormality. Sinuses/Orbits: Visualized paranasal sinuses and mastoids clear. Orbital soft tissues unremarkable. Other: None IMPRESSION: No acute intracranial abnormality. Electronically Signed   By: Charlett Nose M.D.   On: 01/30/2018 23:55    Procedures Procedures (including critical care time)  Medications Ordered in ED Medications - No data to display   Initial Impression / Assessment and Plan / ED Course  I have reviewed the triage vital signs and the nursing  notes.  Pertinent labs & imaging results that were available during my care of the patient were reviewed by me and considered in my medical decision making (see chart for details).    Palpitations with hot flashes last night and throughout the day today which has since resolved.  No chest pain or shortness of breath.  No focal neurological deficits. CT Head negative.   EKG is sinus rhythm.  Orthostatics positive by heart rate.  IV fluid given.  Labs show no anemia.  TSH minimally elevated we will check free T4.  UA consistent with infection we will send culture and start antibiotics. FT4 normal.  Serial troponins negative. No chest pain. NO vertigo. She describes her dizziness as more of a lightheadedness which makes sense  with her orthostasis.  Encourage oral hydration at home. Treat UTI. Reestablish care with PCP to determine whether BP meds need to be restarted. Tolerating PO and ambulatory.  Return precautions discussed.  Final Clinical Impressions(s) / ED Diagnoses   Final diagnoses:  Orthostasis  Urinary tract infection without hematuria, site unspecified    ED Discharge Orders         Ordered    cephALEXin (KEFLEX) 500 MG capsule  2 times daily     01/31/18 0433           Glynn Octave, MD 01/31/18 [phone]

## 2018-01-30 NOTE — ED Triage Notes (Signed)
Pt speaks [place] and requested her son to translate. Pt states that she has been experiencing dizziness, heart beating "hard," feeling "hot", generalized itching, and trouble sleeping x 7months but reports that it has been getting worse.

## 2018-01-30 NOTE — ED Notes (Signed)
Patient reports heart beating fast which started last night. She still feels is fast but not too bad like it started last night. A/O but language barrier. Son at bedside doing translations. Patient from [facility].

## 2018-01-30 NOTE — ED Notes (Signed)
Pt taken to Xray.

## 2018-01-30 NOTE — ED Notes (Signed)
Patient transported to CT 

## 2018-01-31 LAB — URINALYSIS, ROUTINE W REFLEX MICROSCOPIC
BILIRUBIN URINE: NEGATIVE
Glucose, UA: NEGATIVE mg/dL
Ketones, ur: NEGATIVE mg/dL
NITRITE: NEGATIVE
PROTEIN: NEGATIVE mg/dL
Specific Gravity, Urine: 1.016 (ref 1.005–1.030)
pH: 5 (ref 5.0–8.0)

## 2018-01-31 LAB — TROPONIN I

## 2018-01-31 LAB — HEPATIC FUNCTION PANEL
ALT: 20 U/L (ref 0–44)
AST: 19 U/L (ref 15–41)
Albumin: 3.7 g/dL (ref 3.5–5.0)
Alkaline Phosphatase: 56 U/L (ref 38–126)
BILIRUBIN DIRECT: 0.1 mg/dL (ref 0.0–0.2)
BILIRUBIN INDIRECT: 0.3 mg/dL (ref 0.3–0.9)
Total Bilirubin: 0.4 mg/dL (ref 0.3–1.2)
Total Protein: 7.4 g/dL (ref 6.5–8.1)

## 2018-01-31 LAB — CBG MONITORING, ED: GLUCOSE-CAPILLARY: 96 mg/dL (ref 70–99)

## 2018-01-31 LAB — T4, FREE: FREE T4: 0.93 ng/dL (ref 0.82–1.77)

## 2018-01-31 LAB — I-STAT TROPONIN, ED: Troponin i, poc: 0 ng/mL (ref 0.00–0.08)

## 2018-01-31 LAB — TSH: TSH: 4.621 u[IU]/mL — ABNORMAL HIGH (ref 0.350–4.500)

## 2018-01-31 MED ORDER — SODIUM CHLORIDE 0.9 % IV SOLN
1.0000 g | Freq: Once | INTRAVENOUS | Status: AC
  Administered 2018-01-31: 1 g via INTRAVENOUS
  Filled 2018-01-31: qty 10

## 2018-01-31 MED ORDER — SODIUM CHLORIDE 0.9 % IV BOLUS
1000.0000 mL | Freq: Once | INTRAVENOUS | Status: AC
  Administered 2018-01-31: 1000 mL via INTRAVENOUS

## 2018-01-31 MED ORDER — CEPHALEXIN 500 MG PO CAPS: 500.0000 mg | ORAL_CAPSULE | Freq: Two times a day (BID) | ORAL | 0 refills | Status: DC

## 2018-01-31 NOTE — Discharge Instructions (Addendum)
Keep yourself hydrated and take the antibiotics for a urinary tract infection.  He should establish care with a primary doctor.  You should also have a recheck of your blood pressure and thyroid function by her primary doctor.  Return to the ED if you develop chest pain, shortness of breath, unilateral weakness, any other concerns.

## 2018-01-31 NOTE — ED Notes (Signed)
Pt ambulatory to and from hallway bathroom without difficulty, and only standby assist. Denies continued dizziness at this time.

## 2018-01-31 NOTE — ED Notes (Signed)
Reviewed d/c instructions with pt, who verbalized understanding and had no outstanding questions. Pt departed in NAD.   

## 2018-02-01 LAB — URINE CULTURE

## 2019-01-07 ENCOUNTER — Other Ambulatory Visit: Payer: Self-pay

## 2019-01-07 ENCOUNTER — Emergency Department (HOSPITAL_COMMUNITY): Payer: Medicaid Other

## 2019-01-07 ENCOUNTER — Encounter (HOSPITAL_COMMUNITY): Payer: Self-pay | Admitting: Emergency Medicine

## 2019-01-07 ENCOUNTER — Emergency Department (HOSPITAL_COMMUNITY)
Admission: EM | Admit: 2019-01-07 | Discharge: 2019-01-08 | Disposition: A | Discharge status: Home / Self Care | Payer: Medicaid Other | Attending: Emergency Medicine | Admitting: Emergency Medicine

## 2019-01-07 DIAGNOSIS — I10 Essential (primary) hypertension: Secondary | ICD-10-CM | POA: Insufficient documentation

## 2019-01-07 DIAGNOSIS — Z79899 Other long term (current) drug therapy: Secondary | ICD-10-CM | POA: Insufficient documentation

## 2019-01-07 DIAGNOSIS — B349 Viral infection, unspecified: Secondary | ICD-10-CM | POA: Insufficient documentation

## 2019-01-07 DIAGNOSIS — R0789 Other chest pain: Secondary | ICD-10-CM | POA: Insufficient documentation

## 2019-01-07 DIAGNOSIS — Z20828 Contact with and (suspected) exposure to other viral communicable diseases: Secondary | ICD-10-CM | POA: Insufficient documentation

## 2019-01-07 MED ORDER — SODIUM CHLORIDE 0.9% FLUSH: 3.0000 mL | Freq: Once | INTRAVENOUS | Status: DC

## 2019-01-07 NOTE — ED Triage Notes (Addendum)
Patient reports epigastric/central chest pain - feels like burning  onset this evening with mild SOB , no emesis or diaphoresis .

## 2019-01-08 LAB — BASIC METABOLIC PANEL
Anion gap: 9 (ref 5–15)
BUN: 12 mg/dL (ref 8–23)
CO2: 25 mmol/L (ref 22–32)
Calcium: 9.4 mg/dL (ref 8.9–10.3)
Chloride: 101 mmol/L (ref 98–111)
Creatinine, Ser: 0.95 mg/dL (ref 0.44–1.00)
GFR calc Af Amer: >60 mL/min (ref >60)
GFR calc non Af Amer: 59 mL/min — ABNORMAL LOW (ref >60)
Glucose, Bld: 117 mg/dL — ABNORMAL HIGH (ref 70–99)
Potassium: 3.7 mmol/L (ref 3.5–5.1)
Sodium: 135 mmol/L (ref 135–145)

## 2019-01-08 LAB — CBC
HCT: 36.9 % (ref 36.0–46.0)
Hemoglobin: 11 g/dL — ABNORMAL LOW (ref 12.0–15.0)
MCH: 23.6 pg — ABNORMAL LOW (ref 26.0–34.0)
MCHC: 29.8 g/dL — ABNORMAL LOW (ref 30.0–36.0)
MCV: 79.2 fL — ABNORMAL LOW (ref 80.0–100.0)
Platelets: 358 10*3/uL (ref 150–400)
RBC: 4.66 MIL/uL (ref 3.87–5.11)
RDW: 16 % — ABNORMAL HIGH (ref 11.5–15.5)
WBC: 11.2 10*3/uL — ABNORMAL HIGH (ref 4.0–10.5)
nRBC: 0 % (ref 0.0–0.2)

## 2019-01-08 LAB — TROPONIN I (HIGH SENSITIVITY)
Troponin I (High Sensitivity): 9 ng/L (ref <18)
Troponin I (High Sensitivity): 7 ng/L (ref <18)

## 2019-01-08 LAB — PROTIME-INR
INR: 1 (ref 0.8–1.2)
Prothrombin Time: 13.1 seconds (ref 11.4–15.2)

## 2019-01-08 MED ORDER — ALUM & MAG HYDROXIDE-SIMETH 200-200-20 MG/5ML PO SUSP
30.0000 mL | Freq: Once | ORAL | Status: AC
  Administered 2019-01-08: 30 mL via ORAL
  Filled 2019-01-08: qty 30

## 2019-01-08 NOTE — Discharge Instructions (Signed)
Try zantac or pepcid twice a day.  Try to avoid things that may make this worse, most commonly these are spicy foods tomato based products fatty foods chocolate and peppermint.  Alcohol and tobacco can also make this worse.  Return to the emergency department for sudden worsening pain fever or inability to eat or drink. ° °

## 2019-01-08 NOTE — ED Notes (Signed)
Complains of persistent pain in the chest/abdomen area rating it 3 out of 10. Also having some dizziness and headaches. Does not speak or understand english but family is present to translate.

## 2019-01-08 NOTE — ED Provider Notes (Signed)
[facility] EMERGENCY DEPARTMENT Provider Note   CSN: [id number] Arrival date & time: 01/07/19  2309     History   Chief Complaint Chief Complaint  Patient presents with  . Chest Pain    HPI [name] is a 73 y.o. female.     73 yo F with a chief complaints of chest discomfort.  States is a 3 out of 10.  Nothing seems to make it better or worse.  Going on since yesterday.  Patient is felt generally unwell.  Had a measured temperature yesterday.  Denies cough denies nausea or vomiting.  Has been able to eat and drink normally.  Denies abdominal pain.  No sick contacts no recent travel.  Denies any prior medical problems denies MI denies PE or DVT.  The history is provided by the patient.  Chest Pain Pain location:  Substernal area Pain quality: aching   Pain radiates to:  Does not radiate Pain severity:  Moderate Onset quality:  Gradual Duration:  1 day Timing:  Constant Progression:  Partially resolved Chronicity:  New Relieved by:  Nothing Worsened by:  Nothing Ineffective treatments:  None tried Associated symptoms: fever   Associated symptoms: no abdominal pain, no dizziness, no headache, no nausea, no palpitations, no shortness of breath and no vomiting     Past Medical History:  Diagnosis Date  . Hypertension     There are no active problems to display for this patient.   History reviewed. No pertinent surgical history.   OB History   No obstetric history on file.      Home Medications    Prior to Admission medications   Medication Sig Start Date End Date Taking? Authorizing Provider  cephALEXin (KEFLEX) 500 MG capsule Take 1 capsule (500 mg total) by mouth 2 (two) times daily. 01/31/18   Ezequiel Essex, MD    Family History No family history on file.  Social History Social History   Tobacco Use  . Smoking status: Never Smoker  . Smokeless tobacco: Never Used  Substance Use Topics  . Alcohol use: Never   Frequency: Never  . Drug use: Never     Allergies   Patient has no known allergies.   Review of Systems Review of Systems  Constitutional: Positive for fever. Negative for chills.  HENT: Negative for congestion and rhinorrhea.   Eyes: Negative for redness and visual disturbance.  Respiratory: Negative for shortness of breath and wheezing.   Cardiovascular: Positive for chest pain. Negative for palpitations.  Gastrointestinal: Negative for abdominal pain, nausea and vomiting.  Genitourinary: Negative for dysuria and urgency.  Musculoskeletal: Negative for arthralgias and myalgias.  Skin: Negative for pallor and wound.  Neurological: Negative for dizziness and headaches.     Physical Exam Updated Vital Signs BP 135/78 (BP Location: Left Arm)   Pulse 69   Temp 97.7 F (36.5 C) (Oral)   Resp 16   SpO2 100%   Physical Exam Vitals signs and nursing note reviewed.  Constitutional:      General: She is not in acute distress.    Appearance: She is well-developed. She is not diaphoretic.  HENT:     Head: Normocephalic and atraumatic.  Eyes:     Pupils: Pupils are equal, round, and reactive to light.  Neck:     Musculoskeletal: Normal range of motion and neck supple.  Cardiovascular:     Rate and Rhythm: Normal rate and regular rhythm.     Heart sounds: No murmur. No  friction rub. No gallop.   Pulmonary:     Effort: Pulmonary effort is normal.     Breath sounds: No wheezing or rales.  Abdominal:     General: There is no distension.     Palpations: Abdomen is soft.     Tenderness: There is no abdominal tenderness.  Musculoskeletal:        General: No tenderness.  Skin:    General: Skin is warm and dry.  Neurological:     Mental Status: She is alert and oriented to person, place, and time.  Psychiatric:        Behavior: Behavior normal.      ED Treatments / Results  Labs (all labs ordered are listed, but only abnormal results are displayed) Labs Reviewed   BASIC METABOLIC PANEL - Abnormal; Notable for the following components:      Result Value   Glucose, Bld 117 (*)    GFR calc non Af Amer 59 (*)    All other components within normal limits  CBC - Abnormal; Notable for the following components:   WBC 11.2 (*)    Hemoglobin 11.0 (*)    MCV 79.2 (*)    MCH 23.6 (*)    MCHC 29.8 (*)    RDW 16.0 (*)    All other components within normal limits  NOVEL CORONAVIRUS, NAA (HOSP ORDER, SEND-OUT TO REF LAB; TAT 18-24 HRS)  PROTIME-INR  TROPONIN I (HIGH SENSITIVITY)  TROPONIN I (HIGH SENSITIVITY)    EKG EKG Interpretation  Date/Time:  Wednesday January 07 2019 23:18:50 EDT Ventricular Rate:  89 PR Interval:  202 QRS Duration: 68 QT Interval:  370 QTC Calculation: 450 R Axis:   27 Text Interpretation:  Normal sinus rhythm Abnormal ECG No significant change since last tracing Confirmed by Melene Plan [phone]) on 01/08/2019 7:02:13 AM   Radiology Dg Chest 2 View  Result Date: 01/07/2019 CLINICAL DATA:  Chest pain EXAM: CHEST - 2 VIEW COMPARISON:  Radiograph 01/30/2018, 07/22/2008 FINDINGS: Coarse basilar interstitial changes are similar to comparison studies. No consolidation, features of edema, pneumothorax, or effusion. Pulmonary vascularity is normally distributed. The cardiomediastinal contours are unremarkable. No acute osseous or soft tissue abnormality. Degenerative changes are present in the imaged spine and shoulders. IMPRESSION: No acute cardiopulmonary abnormality. Electronically Signed   By: Kreg Shropshire M.D.   On: 01/07/2019 23:55    Procedures Procedures (including critical care time)  Medications Ordered in ED Medications  sodium chloride flush (NS) 0.9 % injection 3 mL (3 mLs Intravenous Not Given 01/08/19 0633)  alum & mag hydroxide-simeth (MAALOX/MYLANTA) 200-200-20 MG/5ML suspension 30 mL (has no administration in time range)     Initial Impression / Assessment and Plan / ED Course  I have reviewed the triage vital  signs and the nursing notes.  Pertinent labs & imaging results that were available during my care of the patient were reviewed by me and considered in my medical decision making (see chart for details).        73 yo F with what sounds like a viral illness.  She has been feeling unwell and having fever since yesterday.  She also is describing some chest discomfort.  Not exertional no shortness of breath no diaphoresis no nausea or vomiting.  On exam the patient is well-appearing and nontoxic.  Family states that she feels much better.  No focal abdominal tenderness.  Chest x-ray viewed by me without focal infiltrate or pneumothorax.  EKG unchanged from her prior.  Delta  troponin is negative.  I feel this is unlikely to be a PE.  Most likely this is a viral syndrome.  As this is occurring during the novel coronavirus pandemic will obtain an outpatient swab.  Have her self isolate at home.  PCP follow-up.  Delbra Eick was evaluated in Emergency Department on 01/08/2019 for the symptoms described in the history of present illness. He/she was evaluated in the context of the global COVID-19 pandemic, which necessitated consideration that the patient might be at risk for infection with the SARS-CoV-2 virus that causes COVID-19. Institutional protocols and algorithms that pertain to the evaluation of patients at risk for COVID-19 are in a state of rapid change based on information released by regulatory bodies including the CDC and federal and state organizations. These policies and algorithms were followed during the patient's care in the ED.  7:26 AM:  I have discussed the diagnosis/risks/treatment options with the patient and family and believe the pt to be eligible for discharge home to follow-up with PCP. We also discussed returning to the ED immediately if new or worsening sx occur. We discussed the sx which are most concerning (e.g., sudden worsening pain, fever, inability to tolerate by mouth) that  necessitate immediate return. Medications administered to the patient during their visit and any new prescriptions provided to the patient are listed below.  Medications given during this visit Medications  sodium chloride flush (NS) 0.9 % injection 3 mL (3 mLs Intravenous Not Given 01/08/19 [id number])  alum & mag hydroxide-simeth (MAALOX/MYLANTA) 200-200-20 MG/5ML suspension 30 mL (has no administration in time range)     The patient appears reasonably screen and/or stabilized for discharge and I doubt any other medical condition or other [facility] requiring further screening, evaluation, or treatment in the ED at this time prior to discharge.     Final Clinical Impressions(s) / ED Diagnoses   Final diagnoses:  Atypical chest pain  Viral illness    ED Discharge Orders    None       Melene PlanFloyd, Brieonna Crutcher, DO 01/08/19 [id number]

## 2019-01-09 LAB — NOVEL CORONAVIRUS, NAA (HOSP ORDER, SEND-OUT TO REF LAB; TAT 18-24 HRS): SARS-CoV-2, NAA: NOT DETECTED

## 2019-07-22 ENCOUNTER — Encounter (HOSPITAL_COMMUNITY): Payer: Self-pay

## 2019-07-22 ENCOUNTER — Other Ambulatory Visit: Payer: Self-pay

## 2019-07-22 ENCOUNTER — Emergency Department (HOSPITAL_COMMUNITY)
Admission: EM | Admit: 2019-07-22 | Discharge: 2019-07-23 | Disposition: A | Discharge status: Home / Self Care | Payer: Medicaid Other | Attending: Emergency Medicine | Admitting: Emergency Medicine

## 2019-07-22 DIAGNOSIS — R350 Frequency of micturition: Secondary | ICD-10-CM | POA: Insufficient documentation

## 2019-07-22 DIAGNOSIS — I10 Essential (primary) hypertension: Secondary | ICD-10-CM | POA: Diagnosis not present

## 2019-07-22 DIAGNOSIS — R002 Palpitations: Secondary | ICD-10-CM | POA: Insufficient documentation

## 2019-07-22 LAB — URINALYSIS, ROUTINE W REFLEX MICROSCOPIC
Bilirubin Urine: NEGATIVE
Glucose, UA: NEGATIVE mg/dL
Ketones, ur: NEGATIVE mg/dL
Nitrite: NEGATIVE
Protein, ur: NEGATIVE mg/dL
Specific Gravity, Urine: 1.002 — ABNORMAL LOW (ref 1.005–1.030)
pH: 6 (ref 5.0–8.0)

## 2019-07-22 NOTE — ED Triage Notes (Signed)
Pt accompanied by family member who reports pt feeling like her heart is beating fast and she is peeing more frequently than normal, since last night. Pt a.o, denies chest pain or SOB. Pt a.o

## 2019-07-23 MED ORDER — VITAMIN D (ERGOCALCIFEROL) 1.25 MG (50000 UNIT) PO CAPS: 50000.0000 [IU] | ORAL_CAPSULE | ORAL | 0 refills | Status: AC

## 2019-07-23 MED ORDER — LOSARTAN POTASSIUM 50 MG PO TABS: 50.0000 mg | ORAL_TABLET | Freq: Every day | ORAL | 0 refills | Status: AC

## 2019-07-23 NOTE — ED Provider Notes (Signed)
[facility] EMERGENCY DEPARTMENT Provider Note   CSN: [id number] Arrival date & time: 07/22/19  1659   History Chief Complaint  Patient presents with  . Urinary Frequency  . Tachycardia    [name] is a 74 y.o. female.  The history is provided by the patient. A language interpreter was used.  Urinary Frequency  She has a history of hypertension and comes in complaining of urinary frequency and palpitations for the last 2 days.  She denies chest pain, heaviness, tightness, pressure.  She denies dyspnea.  She denies nausea, vomiting, diarrhea.  She denies diaphoresis.  She denies dysuria and denies urinary tenesmus or urgency.  She denies abdominal pain or flank pain.  Past Medical History:  Diagnosis Date  . Hypertension     There are no problems to display for this patient.   History reviewed. No pertinent surgical history.   OB History   No obstetric history on file.     No family history on file.  Social History   Tobacco Use  . Smoking status: Never Smoker  . Smokeless tobacco: Never Used  Substance Use Topics  . Alcohol use: Never  . Drug use: Never    Home Medications Prior to Admission medications   Medication Sig Start Date End Date Taking? Authorizing Provider  cephALEXin (KEFLEX) 500 MG capsule Take 1 capsule (500 mg total) by mouth 2 (two) times daily. 01/31/18   Glynn Octave, MD    Allergies    Patient has no known allergies.  Review of Systems   Review of Systems  Genitourinary: Positive for frequency.  All other systems reviewed and are negative.   Physical Exam Updated Vital Signs BP (!) 152/92 (BP Location: Right Arm)   Pulse 90   Temp 98.6 F (37 C)   Resp 16   SpO2 99%   Physical Exam Vitals and nursing note reviewed.   74 year old female, resting comfortably and in no acute distress. Vital signs are significant for elevated blood pressure. Oxygen saturation is 99%, which is normal. Head is  normocephalic and atraumatic. PERRLA, EOMI. Oropharynx is clear. Neck is nontender and supple without adenopathy or JVD. Back is nontender and there is no CVA tenderness. Lungs are clear without rales, wheezes, or rhonchi. Chest is nontender. Heart has regular rate and rhythm without murmur. Abdomen is soft, flat, nontender without masses or hepatosplenomegaly and peristalsis is normoactive. Extremities have no cyanosis or edema, full range of motion is present. Skin is warm and dry without rash. Neurologic: Mental status is normal, cranial nerves are intact, there are no motor or sensory deficits.  ED Results / Procedures / Treatments   Labs (all labs ordered are listed, but only abnormal results are displayed) Labs Reviewed  URINALYSIS, ROUTINE W REFLEX MICROSCOPIC - Abnormal; Notable for the following components:      Result Value   Color, Urine COLORLESS (*)    Specific Gravity, Urine 1.002 (*)    Hgb urine dipstick SMALL (*)    Leukocytes,Ua LARGE (*)    Bacteria, UA RARE (*)    All other components within normal limits    EKG EKG Interpretation  Date/Time:  Wednesday July 22 2019 17:05:59 EDT Ventricular Rate:  82 PR Interval:  170 QRS Duration: 68 QT Interval:  374 QTC Calculation: 436 R Axis:   45 Text Interpretation: Normal sinus rhythm Cannot rule out Anterior infarct , age undetermined Abnormal ECG When compared with ECG of 01/07/2019, No significant change was  found Confirmed by Delora Fuel [id number]) on 07/22/2019 11:30:03 PM  Procedures Procedures  Medications Ordered in ED Medications - No data to display  ED Course  I have reviewed the triage vital signs and the nursing notes.  Pertinent lab results that were available during my care of the patient were reviewed by me and considered in my medical decision making (see chart for details).  Subjective palpitations.  Patient felt that she or her heart was racing of the time that her heart monitor showed heart  rate of 70.  ECG shows normal sinus rhythm.  Her palpitations do not seem to be related to any actual cardiac issues.  Urinalysis has large leukocytes on dipstick, but only 0-5 WBCs on microscopic and rare bacteria.  Clinically, this is not a urinary infection.  Patient is advised of this.  She is advised to start checking her blood pressure at home and keep a record of it.  No change in medication today.  Old records are reviewed showing she saw her primary care provider 6 days ago and losartan dose was increased at that time.  MDM Rules/Calculators/A&P  Final Clinical Impression(s) / ED Diagnoses Final diagnoses:  Urinary frequency  Palpitations  Elevated blood pressure reading with diagnosis of hypertension    Rx / DC Orders ED Discharge Orders    None       Delora Fuel, MD 57/32/20 0155

## 2019-07-23 NOTE — Discharge Instructions (Addendum)
Your urine did not show any signs of infection.  In spite of the fact that you feel your heart is racing, it is actually beating normally.  Please check your blood pressure once a day and keep a record of it. Take that record with you when you see your primary care provider.  Continue taking losartan 50 mg every day, and ergocalciferol 1250 mcg every seven days.  Return if you are having any problems.

## 2020-04-05 IMAGING — DX DG CHEST 2V
2 series · 2 of 2 positions shown · non-contrast
Comparison: 07/22/2008

CLINICAL DATA: Weakness

EXAM:
CHEST - 2 VIEW

[w chest lat]
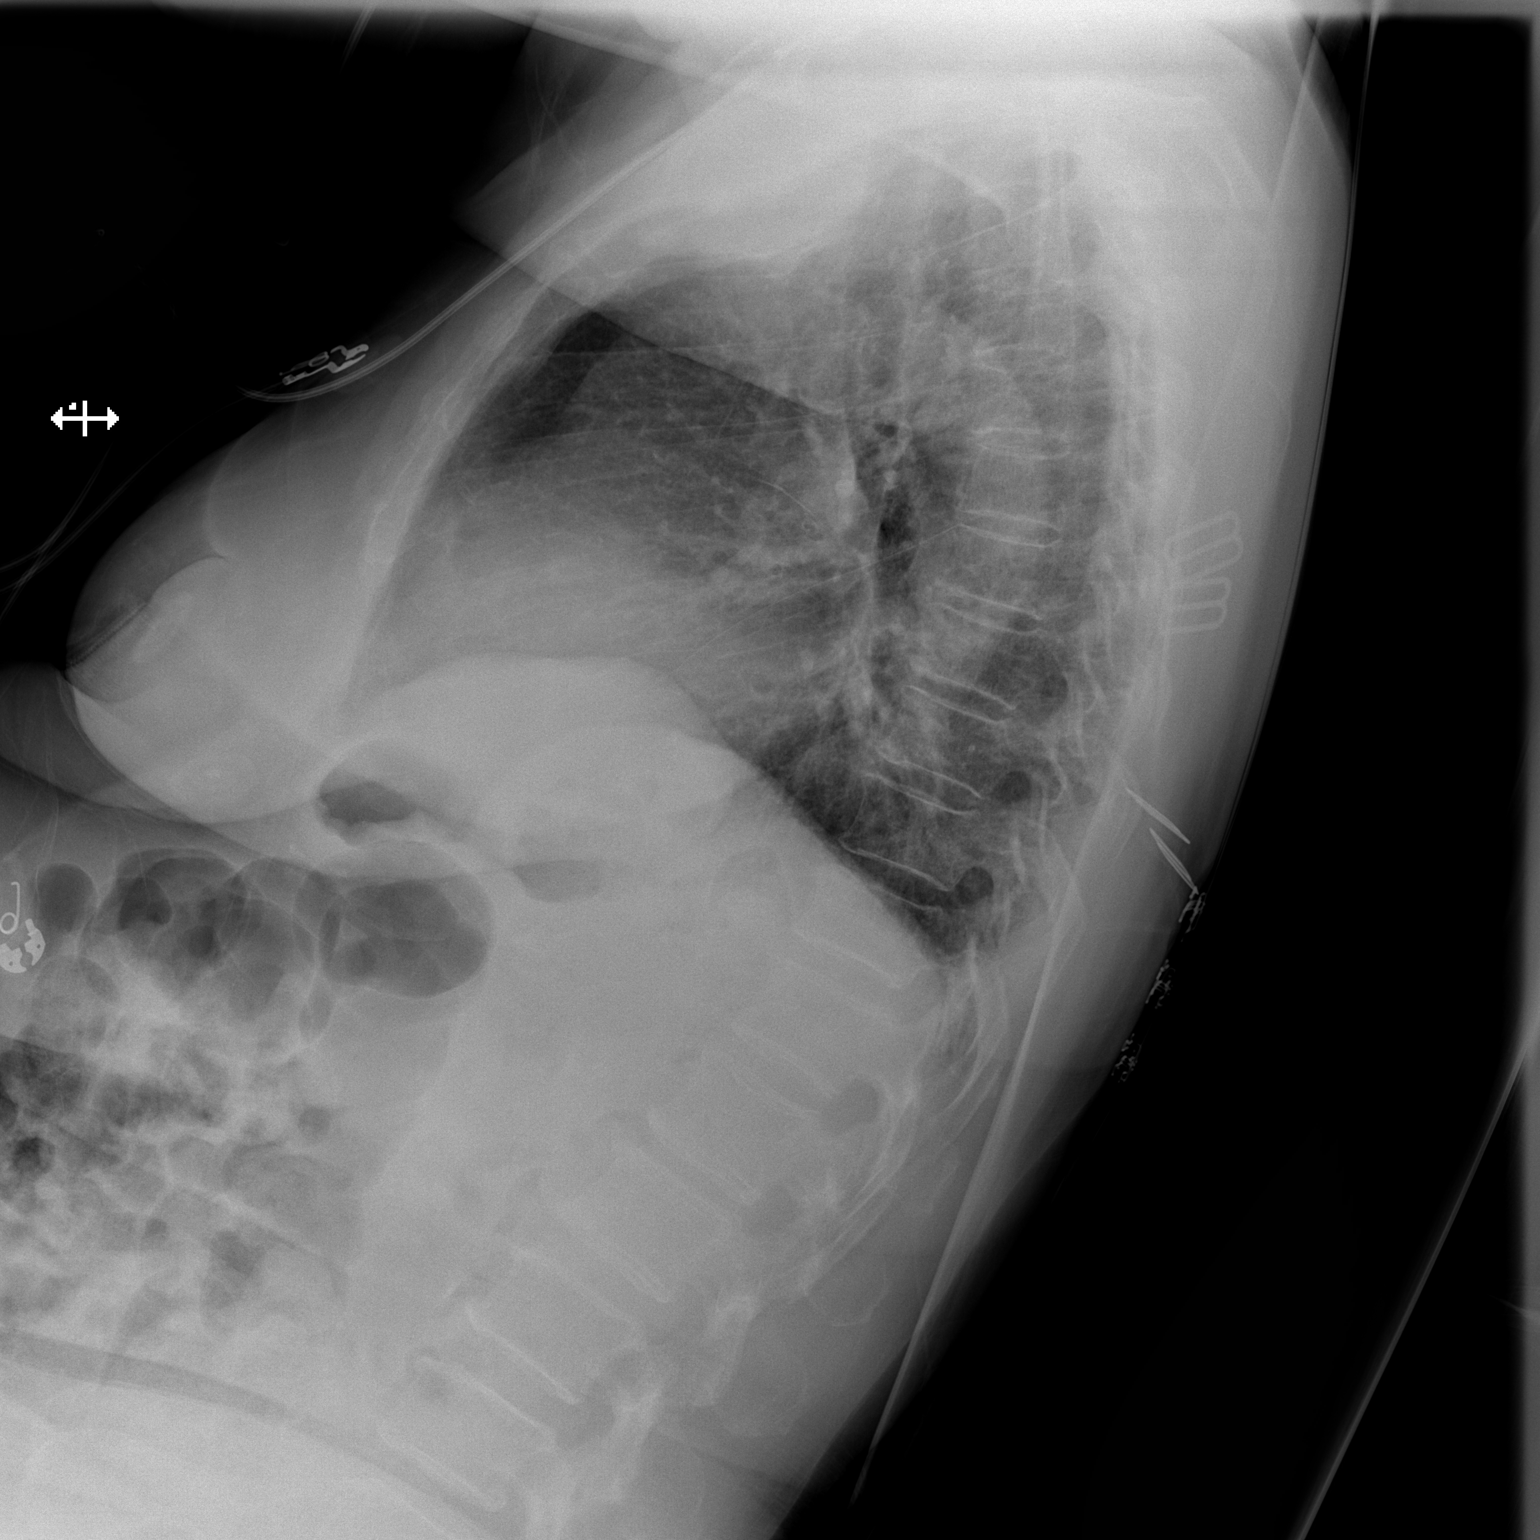

[x chest ap]
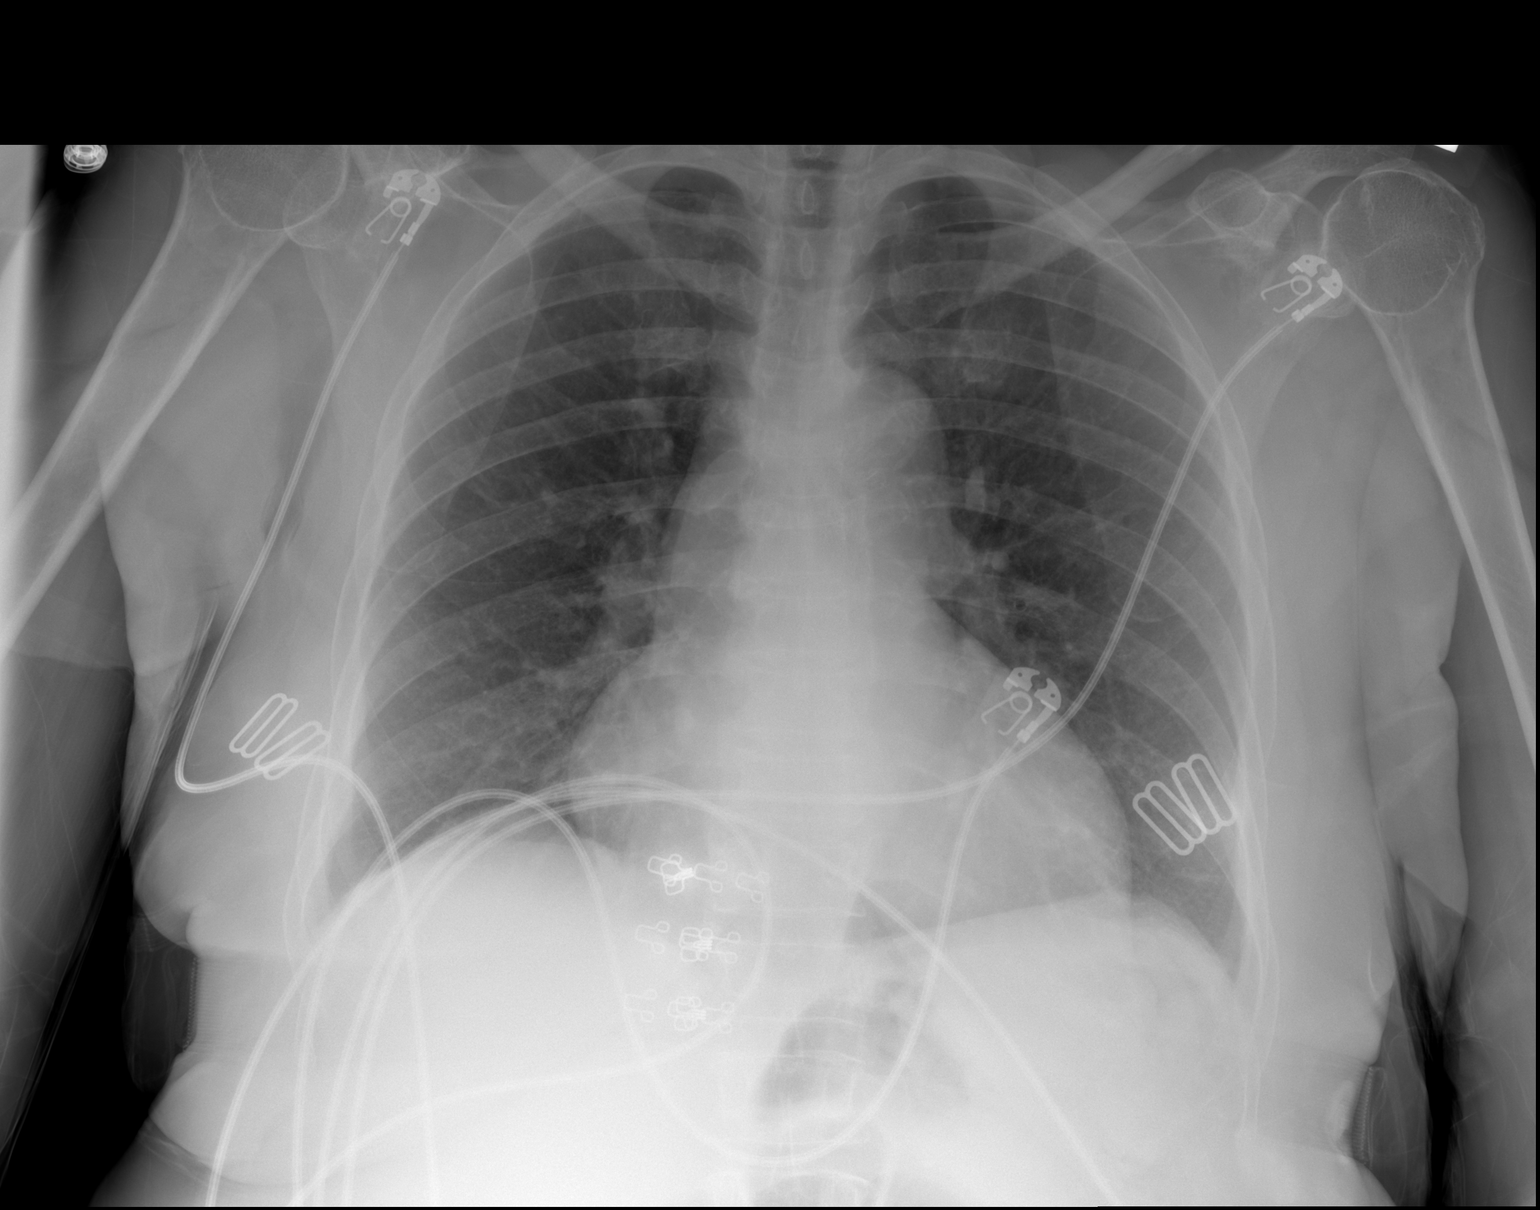

[2 of 2 positions shown; findings below may reference images not displayed]

FINDINGS: Mild cardiomegaly. No significant effusion. No focal consolidation.
No pneumothorax.
IMPRESSION: No active cardiopulmonary disease.  Mild cardiomegaly

## 2023-10-28 ENCOUNTER — Emergency Department (HOSPITAL_COMMUNITY)

## 2023-10-28 ENCOUNTER — Emergency Department (HOSPITAL_COMMUNITY)
Admission: EM | Admit: 2023-10-28 | Discharge: 2023-10-29 | Disposition: A | Discharge status: Home / Self Care | Attending: Emergency Medicine | Admitting: Emergency Medicine

## 2023-10-28 ENCOUNTER — Encounter (HOSPITAL_COMMUNITY): Payer: Self-pay | Admitting: Emergency Medicine

## 2023-10-28 DIAGNOSIS — R1031 Right lower quadrant pain: Secondary | ICD-10-CM | POA: Diagnosis present

## 2023-10-28 DIAGNOSIS — D72829 Elevated white blood cell count, unspecified: Secondary | ICD-10-CM | POA: Diagnosis not present

## 2023-10-28 DIAGNOSIS — N3000 Acute cystitis without hematuria: Secondary | ICD-10-CM

## 2023-10-28 DIAGNOSIS — I10 Essential (primary) hypertension: Secondary | ICD-10-CM | POA: Insufficient documentation

## 2023-10-28 LAB — CBC WITH DIFFERENTIAL/PLATELET
Abs Immature Granulocytes: 0.07 K/uL (ref 0.00–0.07)
Basophils Absolute: 0 K/uL (ref 0.0–0.1)
Basophils Relative: 0 %
Eosinophils Absolute: 0.2 K/uL (ref 0.0–0.5)
Eosinophils Relative: 1 %
HCT: 32 % — ABNORMAL LOW (ref 36.0–46.0)
Hemoglobin: 9.6 g/dL — ABNORMAL LOW (ref 12.0–15.0)
Immature Granulocytes: 1 %
Lymphocytes Relative: 21 %
Lymphs Abs: 2.6 K/uL (ref 0.7–4.0)
MCH: 22.6 pg — ABNORMAL LOW (ref 26.0–34.0)
MCHC: 30 g/dL (ref 30.0–36.0)
MCV: 75.5 fL — ABNORMAL LOW (ref 80.0–100.0)
Monocytes Absolute: 1.1 K/uL — ABNORMAL HIGH (ref 0.1–1.0)
Monocytes Relative: 9 %
Neutro Abs: 8.7 K/uL — ABNORMAL HIGH (ref 1.7–7.7)
Neutrophils Relative %: 68 %
Platelets: 396 K/uL (ref 150–400)
RBC: 4.24 MIL/uL (ref 3.87–5.11)
RDW: 17.1 % — ABNORMAL HIGH (ref 11.5–15.5)
WBC: 12.8 K/uL — ABNORMAL HIGH (ref 4.0–10.5)
nRBC: 0 % (ref 0.0–0.2)

## 2023-10-28 LAB — LIPASE, BLOOD: Lipase: 27 U/L (ref 11–51)

## 2023-10-28 LAB — URINALYSIS, ROUTINE W REFLEX MICROSCOPIC
Bilirubin Urine: NEGATIVE
Glucose, UA: NEGATIVE mg/dL
Ketones, ur: NEGATIVE mg/dL
Nitrite: NEGATIVE
Protein, ur: NEGATIVE mg/dL
Specific Gravity, Urine: 1.003 — ABNORMAL LOW (ref 1.005–1.030)
pH: 7 (ref 5.0–8.0)

## 2023-10-28 LAB — COMPREHENSIVE METABOLIC PANEL WITH GFR
ALT: 24 U/L (ref 0–44)
AST: 14 U/L — ABNORMAL LOW (ref 15–41)
Albumin: 3.1 g/dL — ABNORMAL LOW (ref 3.5–5.0)
Alkaline Phosphatase: 48 U/L (ref 38–126)
Anion gap: 10 (ref 5–15)
BUN: 6 mg/dL — ABNORMAL LOW (ref 8–23)
CO2: 22 mmol/L (ref 22–32)
Calcium: 8.5 mg/dL — ABNORMAL LOW (ref 8.9–10.3)
Chloride: 102 mmol/L (ref 98–111)
Creatinine, Ser: 1.14 mg/dL — ABNORMAL HIGH (ref 0.44–1.00)
GFR, Estimated: 49 mL/min — ABNORMAL LOW (ref >60)
Glucose, Bld: 113 mg/dL — ABNORMAL HIGH (ref 70–99)
Potassium: 3.3 mmol/L — ABNORMAL LOW (ref 3.5–5.1)
Sodium: 134 mmol/L — ABNORMAL LOW (ref 135–145)
Total Bilirubin: 0.7 mg/dL (ref 0.0–1.2)
Total Protein: 6.9 g/dL (ref 6.5–8.1)

## 2023-10-28 MED ORDER — IOHEXOL 350 MG/ML SOLN
75.0000 mL | Freq: Once | INTRAVENOUS | Status: AC | PRN
  Administered 2023-10-28: 75 mL via INTRAVENOUS

## 2023-10-28 MED ORDER — KETOROLAC TROMETHAMINE 30 MG/ML IJ SOLN
15.0000 mg | Freq: Once | INTRAMUSCULAR | Status: AC
  Administered 2023-10-28: 15 mg via INTRAVENOUS
  Filled 2023-10-28: qty 1

## 2023-10-28 MED ORDER — SODIUM CHLORIDE 0.9 % IV BOLUS
1000.0000 mL | Freq: Once | INTRAVENOUS | Status: AC
  Administered 2023-10-28: 1000 mL via INTRAVENOUS

## 2023-10-28 NOTE — ED Triage Notes (Signed)
 Generalized belly pain. Blood in stool. No anticoagulates. Nausea.

## 2023-10-28 NOTE — ED Provider Notes (Signed)
 [place] EMERGENCY DEPARTMENT AT [facility] Provider Note   CSN: [id number] Arrival date & time: 10/28/23  8082     Patient presents with: Abdominal Pain   [name] is a 78 y.o. [place] speaking female.  With a history of hypertension on losartan  who presents to the ED given concern for abdominal pain.  History obtained from her son at bedside who is translating.  Patient has experienced 1 week of ongoing right-sided abdominal pain along with some associated nausea fever and diarrhea.  Pain localized in the right lower quadrant, and right flank.  She has not had urinary symptoms chest pain shortness of breath.  No prior history of abdominal surgeries    Abdominal Pain      Prior to Admission medications   Medication Sig Start Date End Date Taking? Authorizing Provider  losartan  (COZAAR ) 50 MG tablet Take 1 tablet (50 mg total) by mouth daily. 07/23/19   Raford Lenis, MD  Vitamin D , Ergocalciferol , (DRISDOL ) 1.25 MG (50000 UNIT) CAPS capsule Take 1 capsule (50,000 Units total) by mouth every 7 (seven) days. 07/23/19   Raford Lenis, MD    Allergies: Patient has no known allergies.    Review of Systems  Gastrointestinal:  Positive for abdominal pain.    Updated Vital Signs BP (!) 119/52   Pulse 70   Temp 98 F (36.7 C) (Oral)   Resp 20   SpO2 100%   Physical Exam Vitals and nursing note reviewed.  HENT:     Head: Normocephalic and atraumatic.  Eyes:     Pupils: Pupils are equal, round, and reactive to light.  Cardiovascular:     Rate and Rhythm: Normal rate and regular rhythm.  Pulmonary:     Effort: Pulmonary effort is normal.     Breath sounds: Normal breath sounds.  Abdominal:     Palpations: Abdomen is soft.     Tenderness: There is abdominal tenderness in the right lower quadrant. There is right CVA tenderness. There is no guarding or rebound.  Skin:    General: Skin is warm and dry.  Neurological:     Mental Status: She is alert.   Psychiatric:        Mood and Affect: Mood normal.     (all labs ordered are listed, but only abnormal results are displayed) Labs Reviewed  CBC WITH DIFFERENTIAL/PLATELET - Abnormal; Notable for the following components:      Result Value   WBC 12.8 (*)    Hemoglobin 9.6 (*)    HCT 32.0 (*)    MCV 75.5 (*)    MCH 22.6 (*)    RDW 17.1 (*)    Neutro Abs 8.7 (*)    Monocytes Absolute 1.1 (*)    All other components within normal limits  COMPREHENSIVE METABOLIC PANEL WITH GFR - Abnormal; Notable for the following components:   Sodium 134 (*)    Potassium 3.3 (*)    Glucose, Bld 113 (*)    BUN 6 (*)    Creatinine, Ser 1.14 (*)    Calcium 8.5 (*)    Albumin 3.1 (*)    AST 14 (*)    GFR, Estimated 49 (*)    All other components within normal limits  URINALYSIS, ROUTINE W REFLEX MICROSCOPIC - Abnormal; Notable for the following components:   Color, Urine STRAW (*)    Specific Gravity, Urine 1.003 (*)    Hgb urine dipstick SMALL (*)    Leukocytes,Ua MODERATE (*)  Bacteria, UA RARE (*)    All other components within normal limits  URINE CULTURE  LIPASE, BLOOD    EKG: None  Radiology: No results found.   Procedures   Medications Ordered in the ED  sodium chloride  0.9 % bolus 1,000 mL (has no administration in time range)  ketorolac  (TORADOL ) 30 MG/ML injection 15 mg (has no administration in time range)    Clinical Course as of 10/28/23 2326  Mon Oct 28, 2023  2305 Laboratory workup notable for leukocytosis of 12.8.  UA suggestive of potential UTI.  Will send for culture.  [name] DO, am transitioning care of this patient to the oncoming provider pending CT abdomen pelvis reevaluation and disposition [MP]  2309 Stable HO MAP RLQ and right flank tenderness Whitecount and CT AP pending. [CC]  2309 Bacteria, UA(!): RARE [CC]  2309 WBC, UA: 11-20 [CC]  [address]): MODERATE [CC]  2309 WBC(!): 12.8 [CC]    Clinical Course User Index [CC]  Jerral Meth, MD [MP] Marine Ozell LABOR, DO                                 Medical Decision Making 78 year old female with history as above presented to the ED for 1 week of abdominal pain.  Associated fevers diarrhea.  Right lower quadrant right flank discomfort with tenderness in those regions on my exam.  No rebound rigidity or guarding.  Afebrile hemodynamically stable.  Differential diagnosis includes: Acute intra-abdominal infectious/inflammatory process such as appendicitis, diverticulitis, pancreatitis and cholecystitis Urinary tract infection Atypical presentation for pneumonia Viral gastroenteritis  Will obtain laboratory workup including CBC with differential, metabolic panel, lipase and urinalysis along with CT abdomen pelvis to evaluate for the above pathologies  Amount and/or Complexity of Data Reviewed Labs:  Decision-making details documented in ED Course. Radiology: ordered.  Risk Prescription drug management.        Final diagnoses:  Right lower quadrant abdominal pain    ED Discharge Orders     None          Marine Ozell LABOR, DO 10/28/23 2326

## 2023-10-28 NOTE — ED Triage Notes (Signed)
 Pt arrives POV c/o RLQ pain, fever, diarrhea x5 days. Pain increases when she eats. Denies urinary problems and vomiting. AOx4 resp EU Son accompanying pt to translate

## 2023-10-28 NOTE — ED Provider Triage Note (Signed)
 Emergency Medicine Provider Triage Evaluation Note  [name] , a 78 y.o. female  was evaluated in triage.  Pt complains of generalized abdominal pain for the past 3 days.  Also endorsing some nausea and some vomiting.  She also noticed some blood in her stool.  Not anticoagulated.  Also reports a subjective fever.  Review of Systems  Positive: Abdominal pain, diarrhea, fever Negative: Chest pain, shortness of breath  Physical Exam  BP (!) 135/46   Pulse 88   Temp 98 F (36.7 C) (Oral)   Resp 20   SpO2 100%  Gen:   Awake, no distress   Resp:  Normal effort  MSK:   Moves extremities without difficulty  Other:    Medical Decision Making  Medically screening exam initiated at 7:38 PM.  Appropriate orders placed.  [name] was informed that the remainder of the evaluation will be completed by another provider, this initial triage assessment does not replace that evaluation, and the importance of remaining in the ED until their evaluation is complete.     [name], PA-C 10/28/23 1941

## 2023-10-29 MED ORDER — SODIUM CHLORIDE 0.9 % IV SOLN
1.0000 g | Freq: Once | INTRAVENOUS | Status: AC
  Administered 2023-10-29: 1 g via INTRAVENOUS
  Filled 2023-10-29: qty 10

## 2023-10-29 MED ORDER — CEPHALEXIN 500 MG PO CAPS: 500.0000 mg | ORAL_CAPSULE | Freq: Three times a day (TID) | ORAL | 0 refills | Status: AC

## 2023-10-29 NOTE — ED Provider Notes (Signed)
 Care of patient received from prior provider at 1:33 AM, please see their note for complete H/P and care plan.  Received handoff per ED course.  Clinical Course as of 10/29/23 0133  Mon Oct 28, 2023  2305 Laboratory workup notable for leukocytosis of 12.8.  UA suggestive of potential UTI.  Will send for culture.  [name] DO, am transitioning care of this patient to the oncoming provider pending CT abdomen pelvis reevaluation and disposition [MP]  2309 Stable HO MAP RLQ and right flank tenderness Whitecount and CT AP pending. [CC]  2309 Bacteria, UA(!): RARE [CC]  2309 WBC, UA: 11-20 [CC]  [address]): MODERATE [CC]  2309 WBC(!): 12.8 [CC]    Clinical Course User Index [CC] Jerral Meth, MD [MP] Marine Ozell LABOR, DO    Reassessment: CT scan with no focal pathology.  Likely UTI.  Will treat with Rocephin , IV fluids and ambulation trial.  She is feeling better on reassessment. Will send antibiotics recommend follow-up with PCP.     Jerral Meth, MD 10/29/23 [phone]
